# Patient Record
Sex: Male | Born: 1985
Health system: Southern US, Community
[De-identification: ages and names within clinical notes are randomized; demographics above are authoritative.]

## PROBLEM LIST (undated history)

## (undated) DIAGNOSIS — K219 Gastro-esophageal reflux disease without esophagitis: Secondary | ICD-10-CM

## (undated) HISTORY — PX: APPENDECTOMY: SHX54

---

## 2017-10-20 ENCOUNTER — Other Ambulatory Visit: Payer: Self-pay

## 2017-10-20 ENCOUNTER — Encounter (HOSPITAL_COMMUNITY): Payer: Self-pay

## 2017-10-20 ENCOUNTER — Emergency Department (HOSPITAL_COMMUNITY): Payer: Managed Care, Other (non HMO)

## 2017-10-20 ENCOUNTER — Emergency Department (HOSPITAL_COMMUNITY)
Admission: EM | Admit: 2017-10-20 | Discharge: 2017-10-21 | Disposition: A | Discharge status: Home / Self Care | Payer: Managed Care, Other (non HMO) | Attending: Emergency Medicine | Admitting: Emergency Medicine

## 2017-10-20 DIAGNOSIS — J029 Acute pharyngitis, unspecified: Secondary | ICD-10-CM | POA: Diagnosis present

## 2017-10-20 DIAGNOSIS — J039 Acute tonsillitis, unspecified: Secondary | ICD-10-CM

## 2017-10-20 HISTORY — DX: Gastro-esophageal reflux disease without esophagitis: K21.9

## 2017-10-20 LAB — BASIC METABOLIC PANEL
Anion gap: 12 (ref 5–15)
BUN: 5 mg/dL — ABNORMAL LOW (ref 6–20)
CO2: 27 mmol/L (ref 22–32)
Calcium: 9.3 mg/dL (ref 8.9–10.3)
Chloride: 97 mmol/L — ABNORMAL LOW (ref 101–111)
Creatinine, Ser: 1.1 mg/dL (ref 0.61–1.24)
GFR calc Af Amer: >60 mL/min (ref >60)
GFR calc non Af Amer: >60 mL/min (ref >60)
Glucose, Bld: 96 mg/dL (ref 65–99)
Potassium: 3.8 mmol/L (ref 3.5–5.1)
Sodium: 136 mmol/L (ref 135–145)

## 2017-10-20 LAB — CBC WITH DIFFERENTIAL/PLATELET
Basophils Absolute: 0 10*3/uL (ref 0.0–0.1)
Basophils Relative: 0 %
Eosinophils Absolute: 0 10*3/uL (ref 0.0–0.7)
Eosinophils Relative: 1 %
HCT: 44.8 % (ref 39.0–52.0)
Hemoglobin: 14.8 g/dL (ref 13.0–17.0)
Lymphocytes Relative: 21 %
Lymphs Abs: 1.3 10*3/uL (ref 0.7–4.0)
MCH: 26.4 pg (ref 26.0–34.0)
MCHC: 33 g/dL (ref 30.0–36.0)
MCV: 79.9 fL (ref 78.0–100.0)
Monocytes Absolute: 0.7 10*3/uL (ref 0.1–1.0)
Monocytes Relative: 11 %
Neutro Abs: 4.2 10*3/uL (ref 1.7–7.7)
Neutrophils Relative %: 67 %
Platelets: 198 10*3/uL (ref 150–400)
RBC: 5.61 MIL/uL (ref 4.22–5.81)
RDW: 14.5 % (ref 11.5–15.5)
WBC: 6.3 10*3/uL (ref 4.0–10.5)

## 2017-10-20 LAB — RAPID STREP SCREEN (MED CTR MEBANE ONLY): Streptococcus, Group A Screen (Direct): NEGATIVE

## 2017-10-20 MED ORDER — DEXAMETHASONE SODIUM PHOSPHATE 10 MG/ML IJ SOLN
10.0000 mg | Freq: Once | INTRAMUSCULAR | Status: AC
  Administered 2017-10-20: 10 mg via INTRAVENOUS
  Filled 2017-10-20: qty 1

## 2017-10-20 MED ORDER — PENICILLIN V POTASSIUM 250 MG PO TABS
500.0000 mg | ORAL_TABLET | Freq: Once | ORAL | Status: AC
  Administered 2017-10-20: 500 mg via ORAL
  Filled 2017-10-20: qty 2

## 2017-10-20 MED ORDER — SODIUM CHLORIDE 0.9 % IV BOLUS (SEPSIS)
1000.0000 mL | Freq: Once | INTRAVENOUS | Status: AC
  Administered 2017-10-20: 1000 mL via INTRAVENOUS

## 2017-10-20 MED ORDER — KETOROLAC TROMETHAMINE 30 MG/ML IJ SOLN
30.0000 mg | Freq: Once | INTRAMUSCULAR | Status: AC
  Administered 2017-10-20: 30 mg via INTRAVENOUS
  Filled 2017-10-20: qty 1

## 2017-10-20 MED ORDER — IOPAMIDOL (ISOVUE-300) INJECTION 61%
INTRAVENOUS | Status: AC
  Administered 2017-10-20: 75 mL
  Filled 2017-10-20: qty 75

## 2017-10-20 MED ORDER — LIDOCAINE VISCOUS 2 % MT SOLN
15.0000 mL | Freq: Once | OROMUCOSAL | Status: AC
  Administered 2017-10-20: 15 mL via OROMUCOSAL
  Filled 2017-10-20: qty 15

## 2017-10-20 NOTE — ED Triage Notes (Signed)
Pt from home for sore throat and painful swallowing x 5 days. Pt reports he was seen by [facility] 2 days ago and told he was negative for strep throat and might have the flu. Pt also report fever and chills. Pt denies difficulty breathing or cough. A&Ox4. NAD noted. Ambulatory with steady gait. Tonsils red and swollen.

## 2017-10-21 MED ORDER — PENICILLIN V POTASSIUM 500 MG PO TABS: 500.0000 mg | ORAL_TABLET | Freq: Two times a day (BID) | ORAL | 0 refills | Status: AC

## 2017-10-21 MED ORDER — IBUPROFEN 600 MG PO TABS: 600.0000 mg | ORAL_TABLET | Freq: Four times a day (QID) | ORAL | 0 refills | Status: AC | PRN

## 2017-10-21 NOTE — ED Provider Notes (Signed)
[facility] EMERGENCY DEPARTMENT Provider Note   CSN: [id number] Arrival date & time: 10/20/17  1826     History   Chief Complaint Chief Complaint  Patient presents with  . Sore Throat    HPI Levi Morrow is a 32 y.o. male.  Levi Morrow is a 32 y.o. Male with a history of reflux, presents to the ED for evaluation of persistent sore throat.  Patient reports for the past 5 days patient has had severe sore throat and pain with swallowing.  He has not had any drooling or difficulty tolerating his secretions, but reports a decreased appetite due to pain.  Patient reports he was seen at urgent care in [place] 2 days ago had negative strep test thought this could be the flu and began treating him empirically with Tamiflu.  Patient reports he is continued to have intermittent fevers and chills, T-max at home was 102.1 yesterday.  Fever responds well to ibuprofen and Tylenol.  Patient reports diffuse tenderness throughout the neck worsening over the past few days, but normal range of motion.  Patient reports mild nasal congestion and minimal rhinorrhea, he has had some discomfort in both the ears intermittently, he reports a very occasional cough, no difficulty breathing or chest pain.  Patient reports one episode of emesis yesterday and some mild nausea, no abdominal pain or diarrhea.  She reports symptoms have not improved at all, despite taking Tamiflu and symptomatic treatment with TheraFlu, NyQuil, Tylenol and holds throat lozenges.  He returned to urgent care today where he again had negative strep test, and he was sent to the emergency department for further evaluation.      Past Medical History:  Diagnosis Date  . GERD (gastroesophageal reflux disease)     There are no active problems to display for this patient.   Past Surgical History:  Procedure Laterality Date  . APPENDECTOMY         Home Medications    Prior to Admission medications     Medication Sig Start Date End Date Taking? Authorizing Provider  ibuprofen (ADVIL,MOTRIN) 600 MG tablet Take 1 tablet (600 mg total) by mouth every 6 (six) hours as needed. 10/21/17   Dartha Lodge, PA-C  penicillin v potassium (VEETID) 500 MG tablet Take 1 tablet (500 mg total) by mouth 2 (two) times daily for 10 days. 10/21/17 10/31/17  Dartha Lodge, PA-C    Family History No family history on file.  Social History Social History   Tobacco Use  . Smoking status: Never Smoker  . Smokeless tobacco: Never Used  Substance Use Topics  . Alcohol use: Yes    Comment: occasional  . Drug use: No     Allergies   Patient has no known allergies.   Review of Systems Review of Systems  Constitutional: Positive for appetite change, chills and fever.  HENT: Positive for congestion, ear pain, rhinorrhea, sore throat and trouble swallowing. Negative for drooling, ear discharge, sinus pressure and sinus pain.   Eyes: Negative for discharge, redness and itching.  Respiratory: Negative for cough, chest tightness, shortness of breath, wheezing and stridor.   Cardiovascular: Negative for chest pain.  Gastrointestinal: Positive for nausea and vomiting. Negative for abdominal pain and diarrhea.  Genitourinary: Negative for dysuria.  Musculoskeletal: Positive for neck pain. Negative for arthralgias, myalgias and neck stiffness.  Skin: Negative for color change, pallor and rash.  Neurological: Negative for dizziness, weakness, light-headedness and headaches.     Physical Exam  Updated Vital Signs BP (!) 141/81 (BP Location: Right Arm)   Pulse 77   Temp 98.7 F (37.1 C) (Oral)   Resp 18   Ht 5\' 11"  (1.803 m)   Wt 89.4 kg (197 lb)   SpO2 99%   BMI 27.48 kg/m   Physical Exam  Constitutional: He is oriented to person, place, and time. He appears well-developed and well-nourished.  Non-toxic appearance. He does not appear ill. No distress.  Patient appears uncomfortable and anxious but is  in no acute distress  HENT:  Head: Normocephalic and atraumatic.  Right Ear: Tympanic membrane normal.  Left Ear: Tympanic membrane normal.  Mouth/Throat: Uvula is midline. No uvula swelling. Posterior oropharyngeal edema and posterior oropharyngeal erythema present. No oropharyngeal exudate or tonsillar abscesses. Tonsils are 3+ on the right. Tonsils are 3+ on the left. No tonsillar exudate.  Bilateral TMs unremarkable with good cone of light, nasal passages with mild mucosal edema and minimal clear rhinorrhea, posterior oropharynx is extremely erythematous with 3+ bilateral tonsillar edema, no exudates, uvula is midline, there is no evidence of peritonsillar abscess, airway appears intact, mucous membranes slightly dry  Eyes: Right eye exhibits no discharge. Left eye exhibits no discharge.  Neck: Neck supple.  Diffusely tender cervical lymphadenopathy of submandibular, anterior and posterior chain superficial nodes, no swelling or masses palpated, range of motion in all directions with discomfort, no rigidity, no stridor on auscultation  Cardiovascular: Normal rate, regular rhythm, normal heart sounds and intact distal pulses.  Pulmonary/Chest: Effort normal and breath sounds normal. No stridor. No respiratory distress. He has no wheezes. He has no rales.  Respirations equal and unlabored, patient able to speak in full sentences, lungs clear to auscultation bilaterally  Abdominal: Soft. Bowel sounds are normal. He exhibits no distension and no mass. There is no tenderness. There is no guarding.  Musculoskeletal: He exhibits no edema or deformity.  Lymphadenopathy:    He has cervical adenopathy.  Neurological: He is alert and oriented to person, place, and time. Coordination normal.  Skin: Skin is warm and dry. Capillary refill takes less than 2 seconds.  Nursing note and vitals reviewed.    ED Treatments / Results  Labs (all labs ordered are listed, but only abnormal results are  displayed) Labs Reviewed  BASIC METABOLIC PANEL - Abnormal; Notable for the following components:      Result Value   Chloride 97 (*)    BUN 5 (*)    All other components within normal limits  RAPID STREP SCREEN (NOT AT [facility])  CULTURE, GROUP A STREP ([facility])  CBC WITH DIFFERENTIAL/PLATELET    EKG  EKG Interpretation None       Radiology Ct Soft Tissue Neck W Contrast  Result Date: 10/20/2017 CLINICAL DATA:  Initial evaluation for acute sore throat with dysphagia and odynophagia for 5 days. EXAM: CT NECK WITH CONTRAST TECHNIQUE: Multidetector CT imaging of the neck was performed using the standard protocol following the bolus administration of intravenous contrast. CONTRAST:  75mL ISOVUE-300 IOPAMIDOL (ISOVUE-300) INJECTION 61% COMPARISON:  None available. FINDINGS: Pharynx and larynx: Oral cavity within normal limits without mass lesion or loculated fluid collection. No acute abnormality seen about the dentition. Palatine tonsils are mildly prominent and hyperenhancing bilaterally, suggesting possible acute tonsillitis. No discrete tonsillar or peritonsillar abscess. Parapharyngeal fat maintained. Adenoidal soft tissues within normal limits. No retropharyngeal collection or effusion. Epiglottis within normal limits. Vallecula largely effaced. Remainder of the hypopharynx and supraglottic larynx within normal limits. True cords symmetric and normal.  Subglottic airway clear. Salivary glands: Salivary glands including the parotid and submandibular glands are normal. Thyroid: Thyroid normal. Lymph nodes: Enlarged right level II lymph node measures up to 14 mm. No other pathologically enlarged lymph nodes identified within the neck. Vascular: Normal intravascular enhancement seen throughout the neck. Limited intracranial: Unremarkable. Visualized orbits: Visualized globes and orbital soft tissues within normal limits. Mastoids and visualized paranasal sinuses: Few small retention cyst noted within the  sphenoid sinuses and right maxillary sinus. Visualized paranasal sinuses are otherwise clear. Mastoid air cells and middle ear cavities are well pneumatized and clear. Skeleton: No acute osseous abnormality. No worrisome lytic or blastic osseous lesions. Upper chest: Visualized upper chest within normal limits. Visualized lungs are clear. Other: None. IMPRESSION: 1. Mild prominence and hyperenhancement of the palatine tonsils bilaterally, which may reflect acute tonsillitis. No discrete tonsillar or peritonsillar abscess. 2. Mildly enlarged right level II lymph node, likely reactive. 3. No other acute abnormality within the neck.  Normal epiglottis. Electronically Signed   By: Rise MuBenjamin  McClintock M.D.   On: 10/20/2017 23:36    Procedures Procedures (including critical care time)  Medications Ordered in ED Medications  sodium chloride 0.9 % bolus 1,000 mL (0 mLs Intravenous Stopped 10/20/17 2347)  ketorolac (TORADOL) 30 MG/ML injection 30 mg (30 mg Intravenous Given 10/20/17 2144)  iopamidol (ISOVUE-300) 61 % injection (75 mLs  Contrast Given 10/20/17 2301)  dexamethasone (DECADRON) injection 10 mg (10 mg Intravenous Given 10/20/17 2351)  penicillin v potassium (VEETID) tablet 500 mg (500 mg Oral Given 10/20/17 2352)  lidocaine (XYLOCAINE) 2 % viscous mouth solution 15 mL (15 mLs Mouth/Throat Given 10/20/17 2352)     Initial Impression / Assessment and Plan / ED Course  I have reviewed the triage vital signs and the nursing notes.  Pertinent labs & imaging results that were available during my care of the patient were reviewed by me and considered in my medical decision making (see chart for details).  Patient presents for 5 days of persistent sore throat and painful swallowing, diffuse tenderness to the neck.  On exam patient initially with low-grade fever of 99.4 and tachycardic, reports fevers of 102 at home.  Patient appears uncomfortable but is in no acute distress.  On exam posterior oropharynx  is extremely erythematous with tonsillar edema, without exudates, rapid strep negative.  Neck is diffusely tender with lymphadenopathy, no obvious palpable masses, no stridor or evidence of respiratory distress lungs clear to auscultation.  Given severe erythema of throat, and pain that has been refractory to symptomatic treatment, and progressively worsening neck tenderness will get basic labs and soft tissue contrasted CT of the neck to rule out retropharyngeal abscess, suspect tonsillitis.  Will give fluids, Toradol and Decadron.  Labs reassuring, there is no leukocytosis and normal hemoglobin, no electrolyte derangements requiring intervention.  CT shows mild prominence and hyperenhancement of the palatine tonsils suggestive of tonsillitis, there is no evidence of peritonsillar abscess or retropharyngeal abscess, reactive lymphadenopathy present.  Because these results with the patient, he was able to tolerate first dose of penicillin here in the ED as well as fluids.  Viscous lidocaine provided for pain.  At this time patient is stable for discharge home, 10-day prescription of penicillin provided, discussed pain management with ibuprofen and Tylenol as well as Chloraseptic spray or cervical throat lozenges.  Patient to follow-up closely with his primary care doctor.  Strict return precautions discussed.  Patient expresses understanding and is in agreement with plan in no acute distress at  discharge.  Vitals:   10/20/17 1845 10/20/17 2028 10/20/17 2258 10/21/17 0025  BP:  128/79 120/73 (!) 141/81  Pulse:  80 88 77  Resp:  16 14 18   Temp:    98.7 F (37.1 C)  TempSrc:    Oral  SpO2:  100% 98% 99%  Weight: 89.4 kg (197 lb)     Height: 5\' 11"  (1.803 m)        Final Clinical Impressions(s) / ED Diagnoses   Final diagnoses:  Tonsillitis    ED Discharge Orders        Ordered    penicillin v potassium (VEETID) 500 MG tablet  2 times daily     10/21/17 0009    ibuprofen (ADVIL,MOTRIN) 600 MG  tablet  Every 6 hours PRN     10/21/17 0009       Dartha Lodge, PA-C 10/21/17 0200    Tegeler, Canary Brim, MD 10/21/17 [id number]

## 2017-10-21 NOTE — Discharge Instructions (Signed)
CT scan shows tonsillitis with reactive enlargement of lymph nodes, labs are reassuring.  Please take penicillin twice daily for the next 10 days, you may use ibuprofen and Tylenol for pain as well as Chloraseptic throat spray or septal cold throat lozenges.  Please ensure you are drinking plenty of fluids.  Please follow-up closely with your primary care doctor.  If if sore throat or neck pain is getting worse, you are having persistent fevers, nausea vomiting and or unable to keep down your antibiotics, any difficulty breathing or other new or concerning symptoms please return to the emergency department for reevaluation.

## 2017-10-23 LAB — CULTURE, GROUP A STREP (THRC)

## 2018-11-10 IMAGING — CT CT NECK W/ CM
4 of 5 series · 14 of 33 positions shown, 16 images · IV contrast (Omni 300)
Comparison: None available.

CLINICAL DATA: Initial evaluation for acute sore throat with
dysphagia and odynophagia for 5 days.

EXAM:
CT NECK WITH CONTRAST
TECHNIQUE: Multidetector CT imaging of the neck was performed using the
standard protocol following the bolus administration of intravenous
contrast.
CONTRAST:  75mL [id number] IOPAMIDOL ([id number]) INJECTION 61%

[Series 3: neck 2.0 st · axial · 0.47mm/px · z∈[+1268,+1320]mm · 2 of 131 slices shown (1 of 3)]
[im 27/131  bone]
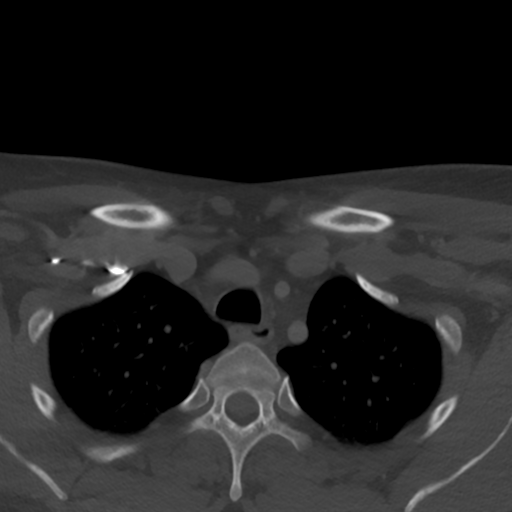
[im 53/131  bone]
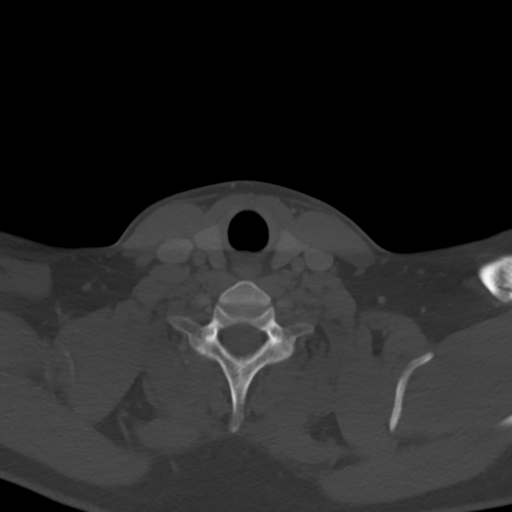

[Series 5: neck 2.0 st · sagittal · 0.51mm/px · 5 of 93 slices shown, 6 images (2 of 3)]
[im 31/93  bone]
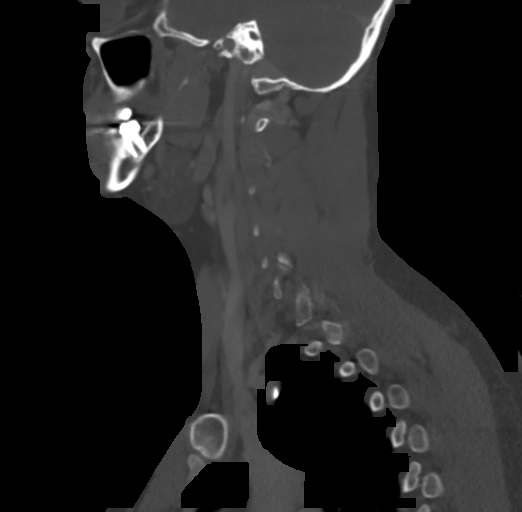
[im 39/93  bone]
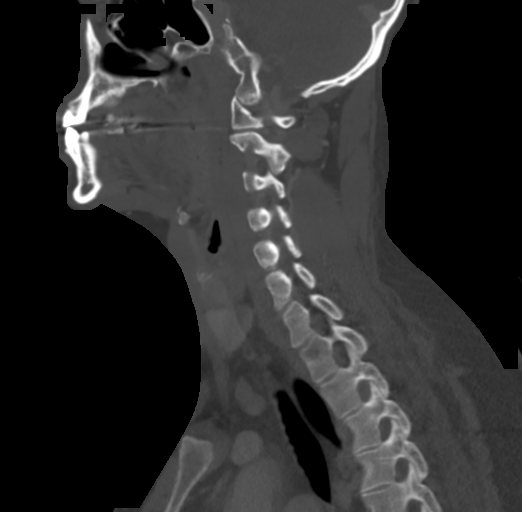
[im 47/93  soft-tissue]
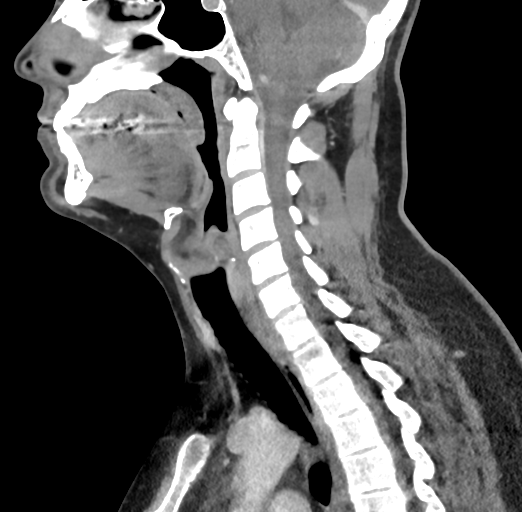
[im 47/93  bone]
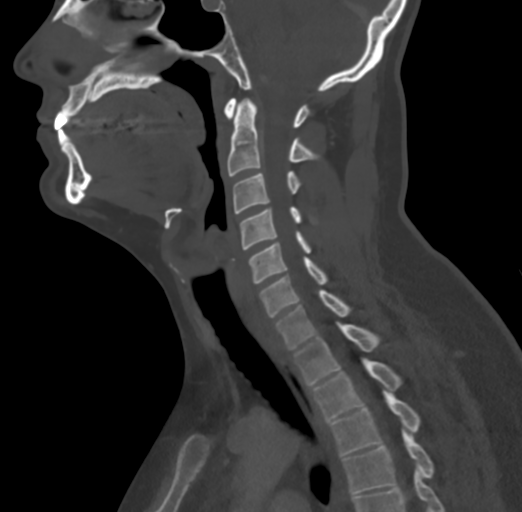
[im 54/93  bone]
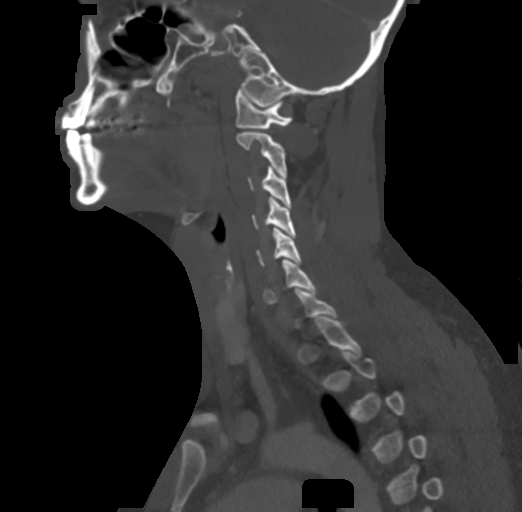
[im 62/93  bone]
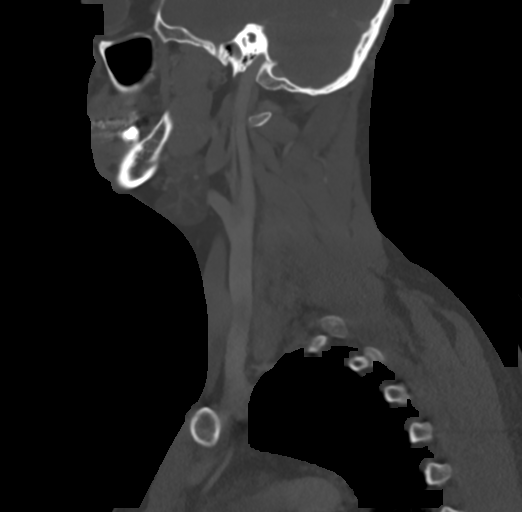

[Series 6: neck 2.0 st · coronal · 0.38mm/px · 3 of 117 slices shown (3 of 3)]
[im 24/117  bone]
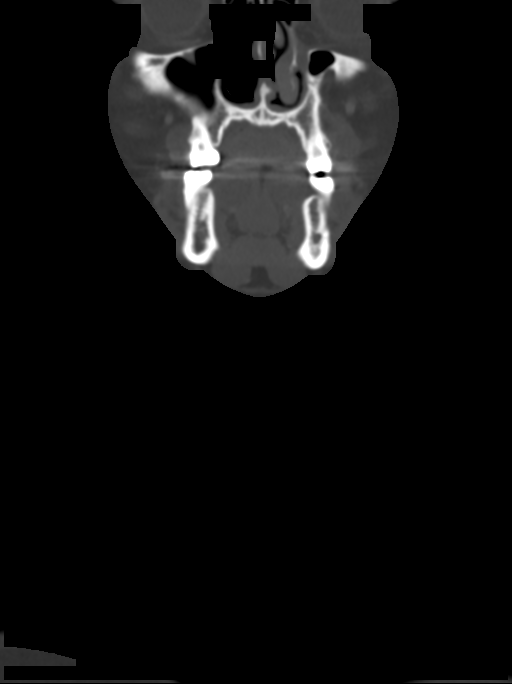
[im 47/117  bone]
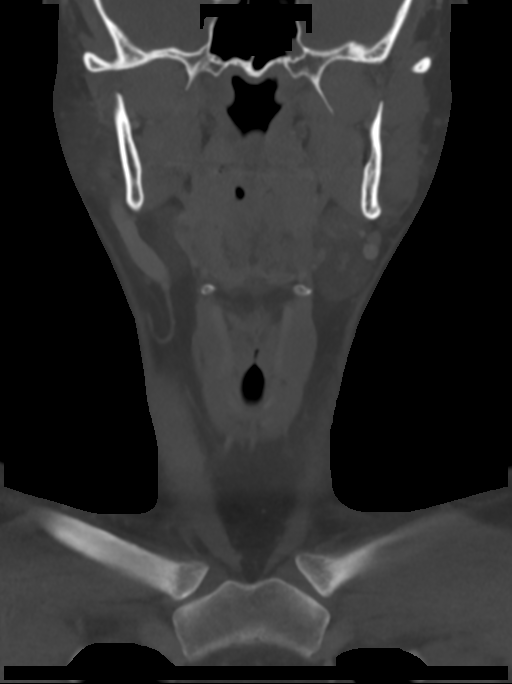
[im 70/117  bone]
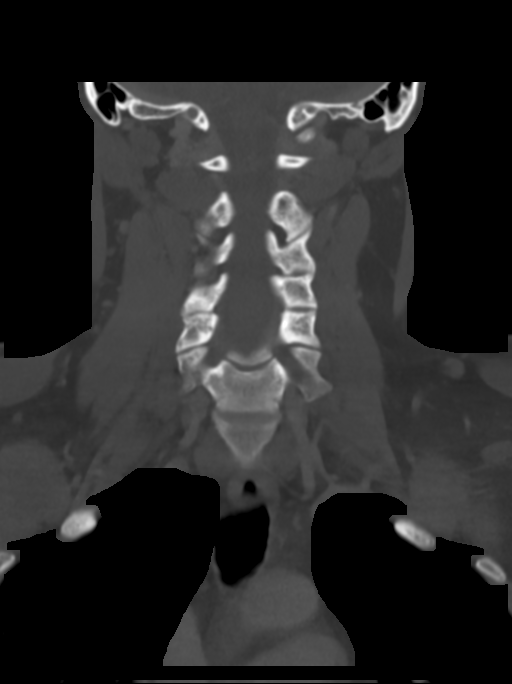

[Series 7: neck 2.0 st orthogonal · axial · 0.39mm/px · z∈[+1267,+1425]mm · 4 of 133 slices shown, 5 images]
[im 27/133  soft-tissue]
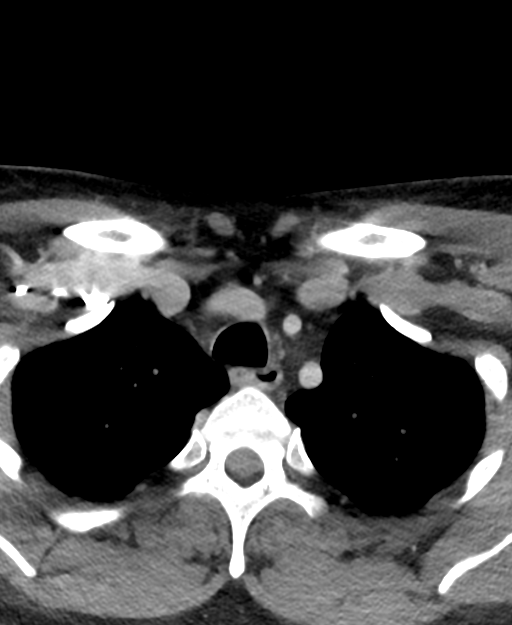
[im 27/133  bone]
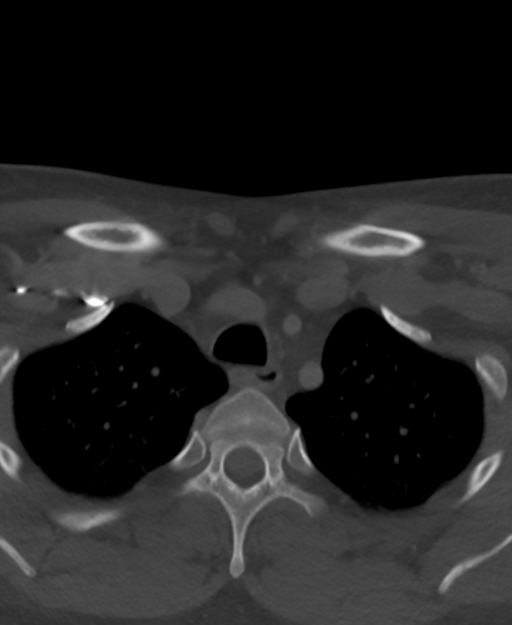
[im 53/133  bone]
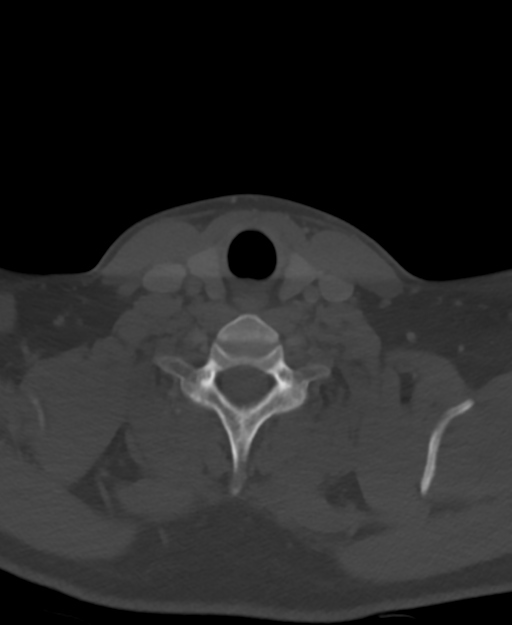
[im 80/133  bone]
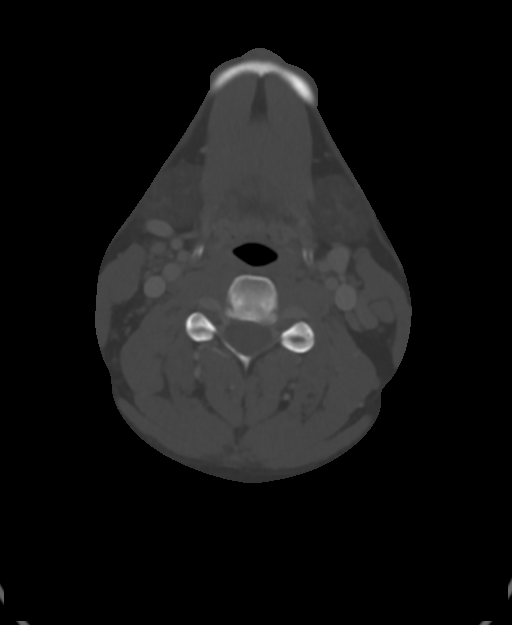
[im 106/133  bone]
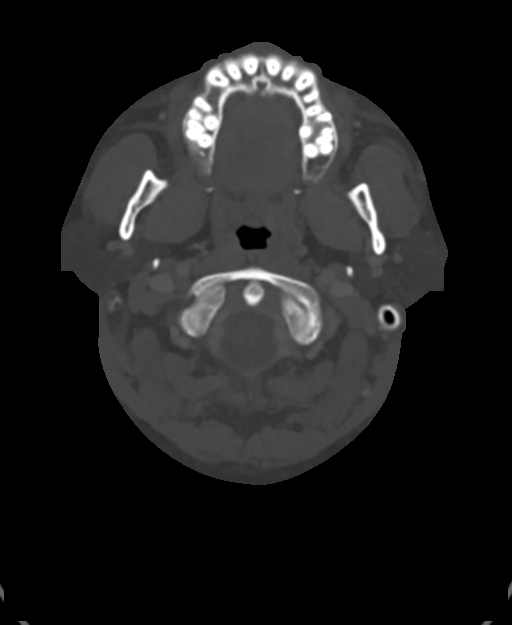

[14 of 33 positions shown; findings below may reference images not displayed]

FINDINGS: Pharynx and larynx: Oral cavity within normal limits without mass
lesion or loculated fluid collection. No acute abnormality seen
about the dentition. Palatine tonsils are mildly prominent and
hyperenhancing bilaterally, suggesting possible acute tonsillitis.
No discrete tonsillar or peritonsillar abscess. Parapharyngeal fat
maintained. Adenoidal soft tissues within normal limits. No
retropharyngeal collection or effusion. Epiglottis within normal
limits. Vallecula largely effaced. Remainder of the hypopharynx and
supraglottic larynx within normal limits. True cords symmetric and
normal. Subglottic airway clear.

Salivary glands: Salivary glands including the parotid and
submandibular glands are normal.

Thyroid: Thyroid normal.

Lymph nodes: Enlarged right level II lymph node measures up to 14
mm. No other pathologically enlarged lymph nodes identified within
the neck.

Vascular: Normal intravascular enhancement seen throughout the neck.

Limited intracranial: Unremarkable.

Visualized orbits: Visualized globes and orbital soft tissues within
normal limits.

Mastoids and visualized paranasal sinuses: Few small retention cyst
noted within the sphenoid sinuses and right maxillary sinus.
Visualized paranasal sinuses are otherwise clear. Mastoid air cells
and middle ear cavities are well pneumatized and clear.

Skeleton: No acute osseous abnormality. No worrisome lytic or
blastic osseous lesions.

Upper chest: Visualized upper chest within normal limits. Visualized
lungs are clear.

Other: None.
IMPRESSION: 1. Mild prominence and hyperenhancement of the palatine tonsils
bilaterally, which may reflect acute tonsillitis. No discrete
tonsillar or peritonsillar abscess.
2. Mildly enlarged right level II lymph node, likely reactive.
3. No other acute abnormality within the neck.  Normal epiglottis.
